# Patient Record
Sex: Female | Born: 2000 | Race: Black or African American | Hispanic: No | Marital: Single | Smoking: Never smoker
Health system: Southern US, Community
[De-identification: ages and names within clinical notes are randomized; demographics above are authoritative.]

## PROBLEM LIST (undated history)

## (undated) DIAGNOSIS — F419 Anxiety disorder, unspecified: Secondary | ICD-10-CM

## (undated) HISTORY — PX: TONGUE FLAP RELEASE: SHX2537

---
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
## 2020-08-29 ENCOUNTER — Other Ambulatory Visit: Payer: Self-pay

## 2020-08-29 ENCOUNTER — Emergency Department (HOSPITAL_COMMUNITY)
Admission: EM | Admit: 2020-08-29 | Discharge: 2020-08-29 | Disposition: A | Discharge status: Home / Self Care | Payer: Medicaid Other | Attending: Emergency Medicine | Admitting: Emergency Medicine

## 2020-08-29 ENCOUNTER — Encounter (HOSPITAL_COMMUNITY): Payer: Self-pay | Admitting: Emergency Medicine

## 2020-08-29 DIAGNOSIS — F419 Anxiety disorder, unspecified: Secondary | ICD-10-CM | POA: Diagnosis not present

## 2020-08-29 DIAGNOSIS — F418 Other specified anxiety disorders: Secondary | ICD-10-CM | POA: Diagnosis not present

## 2020-08-29 DIAGNOSIS — Y900 Blood alcohol level of less than 20 mg/100 ml: Secondary | ICD-10-CM | POA: Diagnosis not present

## 2020-08-29 DIAGNOSIS — Z79899 Other long term (current) drug therapy: Secondary | ICD-10-CM | POA: Diagnosis not present

## 2020-08-29 DIAGNOSIS — F32A Depression, unspecified: Secondary | ICD-10-CM

## 2020-08-29 LAB — CBC WITH DIFFERENTIAL/PLATELET
Abs Immature Granulocytes: 0.01 10*3/uL (ref 0.00–0.07)
Basophils Absolute: 0 10*3/uL (ref 0.0–0.1)
Basophils Relative: 0 %
Eosinophils Absolute: 0 10*3/uL (ref 0.0–0.5)
Eosinophils Relative: 1 %
HCT: 39.1 % (ref 36.0–46.0)
Hemoglobin: 12.5 g/dL (ref 12.0–15.0)
Immature Granulocytes: 0 %
Lymphocytes Relative: 38 %
Lymphs Abs: 1.7 10*3/uL (ref 0.7–4.0)
MCH: 30.2 pg (ref 26.0–34.0)
MCHC: 32 g/dL (ref 30.0–36.0)
MCV: 94.4 fL (ref 80.0–100.0)
Monocytes Absolute: 0.4 10*3/uL (ref 0.1–1.0)
Monocytes Relative: 8 %
Neutro Abs: 2.4 10*3/uL (ref 1.7–7.7)
Neutrophils Relative %: 53 %
Platelets: 248 10*3/uL (ref 150–400)
RBC: 4.14 MIL/uL (ref 3.87–5.11)
RDW: 12.4 % (ref 11.5–15.5)
WBC: 4.5 10*3/uL (ref 4.0–10.5)
nRBC: 0 % (ref 0.0–0.2)

## 2020-08-29 LAB — HCG, QUANTITATIVE, PREGNANCY: hCG, Beta Chain, Quant, S: <1 m[IU]/mL (ref <5)

## 2020-08-29 LAB — COMPREHENSIVE METABOLIC PANEL
ALT: 13 U/L (ref 0–44)
AST: 15 U/L (ref 15–41)
Albumin: 4 g/dL (ref 3.5–5.0)
Alkaline Phosphatase: 61 U/L (ref 38–126)
Anion gap: 8 (ref 5–15)
BUN: 6 mg/dL (ref 6–20)
CO2: 27 mmol/L (ref 22–32)
Calcium: 9.1 mg/dL (ref 8.9–10.3)
Chloride: 103 mmol/L (ref 98–111)
Creatinine, Ser: 0.67 mg/dL (ref 0.44–1.00)
GFR, Estimated: >60 mL/min (ref >60)
Glucose, Bld: 92 mg/dL (ref 70–99)
Potassium: 3.6 mmol/L (ref 3.5–5.1)
Sodium: 138 mmol/L (ref 135–145)
Total Bilirubin: 0.7 mg/dL (ref 0.3–1.2)
Total Protein: 7.4 g/dL (ref 6.5–8.1)

## 2020-08-29 LAB — RAPID URINE DRUG SCREEN, HOSP PERFORMED
Amphetamines: NOT DETECTED
Barbiturates: NOT DETECTED
Benzodiazepines: NOT DETECTED
Cocaine: NOT DETECTED
Opiates: NOT DETECTED
Tetrahydrocannabinol: NOT DETECTED

## 2020-08-29 LAB — ETHANOL: Alcohol, Ethyl (B): <10 mg/dL (ref <10)

## 2020-08-29 LAB — PREGNANCY, URINE: Preg Test, Ur: NEGATIVE

## 2020-08-29 MED ORDER — HYDROXYZINE HCL 25 MG PO TABS: 25.0000 mg | ORAL_TABLET | Freq: Three times a day (TID) | ORAL | Status: DC | PRN

## 2020-08-29 MED ORDER — FLUOXETINE HCL 10 MG PO CAPS
10.0000 mg | ORAL_CAPSULE | Freq: Every day | ORAL | Status: DC
  Administered 2020-08-29: 10 mg via ORAL
  Filled 2020-08-29: qty 1

## 2020-08-29 MED ORDER — FLUOXETINE HCL 10 MG PO TABS
10.0000 mg | ORAL_TABLET | Freq: Every day | ORAL | 1 refills | Status: AC
Start: 2020-08-29 — End: ?

## 2020-08-29 MED ORDER — HYDROXYZINE HCL 25 MG PO TABS: 25.0000 mg | ORAL_TABLET | Freq: Three times a day (TID) | ORAL | 1 refills | Status: AC | PRN

## 2020-08-29 NOTE — ED Provider Notes (Signed)
[facility] EMERGENCY DEPARTMENT Provider Note   CSN: [id number] Arrival date & time: 08/29/20  0059     History Chief Complaint  Patient presents with   Anxiety    Kristine Davis is a 20 y.o. female.  The history is provided by the patient and a parent.  Anxiety This is a recurrent problem. The current episode started more than 1 week ago. The problem occurs daily. The problem has been gradually worsening. Nothing aggravates the symptoms. Nothing relieves the symptoms.     Patient reports she has been dealing with anxiety for quite some time.  She reports over the past year she has significant life stressors.  She reports her father passed away over a year ago.  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  She also reports that she is moving to [place] soon with family and she is concerned about this.  She reports at times her anxiety is overwhelming She reports she has had previous thoughts of harming self, but no active plan at this time. She denies any alcohol or drug abuse    Past Surgical History:  Procedure Laterality Date   TONGUE FLAP RELEASE       OB History   No obstetric history on file.     History reviewed. No pertinent family history.  Social History   Tobacco Use   Smoking status: Never  Substance Use Topics   Alcohol use: No   Drug use: No    Home Medications Prior to Admission medications   Medication Sig Start Date End Date Taking? Authorizing Provider  ibuprofen (ADVIL,MOTRIN) 600 MG tablet Take 1 tablet (600 mg total) by mouth every 6 (six) hours as needed. 07/23/14   Ivery Quale, PA-C    Allergies    Strawberry extract  Review of Systems   Review of Systems  Constitutional:  Positive for appetite change and unexpected weight change. Negative for fever.  Gastrointestinal:  Negative for vomiting.  Psychiatric/Behavioral:  The patient is nervous/anxious.   All other systems reviewed and are  negative.  Physical Exam Updated Vital Signs BP 118/77   Pulse 69   Temp 98.2 F (36.8 C) (Oral)   Resp 18   Ht 1.676 m (5\' 6" )   Wt 77.1 kg   SpO2 99%   BMI 27.44 kg/m   Physical Exam CONSTITUTIONAL: Well developed/well nourished HEAD: Normocephalic/atraumatic EYES: EOMI/PERRL ENMT: Mask in place NECK: supple no meningeal signs CV: S1/S2 noted, no murmurs/rubs/gallops noted LUNGS: Lungs are clear to auscultation bilaterally, no apparent distress ABDOMEN: soft NEURO: Pt is awake/alert/appropriate, moves all extremitiesx4.  No facial droop.   EXTREMITIES: pulses normal/equal, full ROM SKIN: warm, color normal PSYCH: no abnormalities of mood noted, alert and oriented to situation  ED Results / Procedures / Treatments   Labs (all labs ordered are listed, but only abnormal results are displayed) Labs Reviewed  RESP PANEL BY RT-PCR (FLU A&B, COVID) ARPGX2  CBC WITH DIFFERENTIAL/PLATELET  COMPREHENSIVE METABOLIC PANEL  RAPID URINE DRUG SCREEN, HOSP PERFORMED  PREGNANCY, URINE  ETHANOL  HCG, QUANTITATIVE, PREGNANCY    EKG None  Radiology No results found.  Procedures Procedures   Medications Ordered in ED Medications - No data to display  ED Course  I have reviewed the triage vital signs and the nursing notes.    MDM Rules/Calculators/A&P  Patient presents with episodes of severe anxiety.  Patient has had multiple stressors in the past year.  She does report that she has had thoughts of harm in the past, but not tonight.  Patient has not had any outpatient evaluation and lacks resources for mental health services. I informed the patient that I am concerned about her mental health and lack of outpatient resources.  She agrees to stay in the emergency department to be seen by her behavioral health team.  Labs are pending at this time. Disposition pending behavioral health evaluation  7:08 AM Signed out to Dr Estell Harpin at Shift  change  Final Clinical Impression(s) / ED Diagnoses Final diagnoses:  Anxiety    Rx / DC Orders ED Discharge Orders     None        Zadie Rhine, MD 08/29/20 [phone]

## 2020-08-29 NOTE — Discharge Instructions (Addendum)
Follow-up as instructed by behavioral health 

## 2020-08-29 NOTE — ED Triage Notes (Signed)
Pt c/o anxiety on and off for the past few months. Pt also states she also hasn't been eating and drinking like she needs to.

## 2020-08-29 NOTE — Consult Note (Signed)
Telepsych Consultation   Reason for Consult:  Psych consult Referring Physician:  Zadie Rhine, MD Location of Patient: [facility] Location of Provider: [facility]  Patient Identification: Kristine Davis MRN:  [id number] Principal Diagnosis: Depression Diagnosis:  Principal Problem:   Depression Active Problems:   Anxiety   Total Time spent with patient: 45 minutes  Subjective:   Kristine Davis is a 20 y.o. female patient admitted with a recurrent problem. The current episode started more than 1 week ago. The problem occurs daily. The problem has been gradually worsening. Nothing aggravates the symptoms. Nothing relieves the symptoms.    Patient reports she has been dealing with anxiety for quite some time.  She reports over the past year she has significant life stressors.  She reports her father passed away over a year ago.  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  She also reports that she is moving to [place] soon with family and she is concerned about this.  She reports at times her anxiety is overwhelming She reports she has had previous thoughts of harming self, but no active plan at this time. She denies any alcohol or drug abuse  HPI:  Patient seen via tele health by this provider; chart reviewed and consulted with Dr. Lucianne Muss on 08/29/20.  On evaluation Kristine Davis is in a sitting position facing the camera with good eye contact. She is alert and oriented x4. She is calm and cooperative.Her mood is dysphoric and affect is congruent. Her thought process is logical and relevant.She is goal oriented and shows good insight. She denies suicidal ideations. She denies a past hx of suicide attempts. She does report a history of cutting because it made her feel better. She states that she last made cuts 1 month ago. She denies homicidal ideations. She denies auditory and visual hallucinations. She does not appear to be responding to internal or external  stimuli. She reports that she has been having anxiety, depression and other symptoms. She reports having anxiety for the past 7 years but states that it was never diagnosed. She states that her anxiety comes in stages depending on what she is going through. She states that last year her dad died due to cancer and she had a lot to deal with in school, and relationships. She describes her anxiety as feeling irritable, hot and cold flashes, dissociating, excessive crying, and worrying a lot. She states that her depression has been getting worse and that she did not grieve her father's death. She states that her depression is off and on and there are times where she feels euphoric and then feels stressed and depressed. She states that she often feels down thinking about life and her dad and then she will self sabotage and get really anxious. She reports having poor sleep when she is over thinking things. She reports on average getting 7 hours of sleep per night. She reports having a poor appetite since getting COVID last year and states that she stopped eating meat. She states that she lives with her mother and 74 year old brother. She denies having any guns or weapons in the home. She reports that her mother, boyfriend, and brother are her support persons. She denies having a past psychiatric history and has never been prescribed psychotropics. She is interested in medication management for depression and anxiety. We discussed starting Prozac 10 mg by mouth daily for depression and anxiety. We discussed starting Vistaril 25 mg by mouth  3 times a day as needed for anxiety. We discussed the side effects and benefits of taking these medications. Patient will be provided with a 30 day prescription for Prozac and Vistaril with 1 refill. Patient was advised to follow up with outpatient psychiatry for counseling services and medication management.     Past Psychiatric History: None  Risk to Self:  denies  Risk to  Others:  denies  Prior Inpatient Therapy:  no Prior Outpatient Therapy:  no  Past Medical History: History reviewed. No pertinent past medical history.  Past Surgical History:  Procedure Laterality Date   TONGUE FLAP RELEASE     Family History: History reviewed. No pertinent family history. Family Psychiatric  History: Unknown Social History:  Social History   Substance and Sexual Activity  Alcohol Use No     Social History   Substance and Sexual Activity  Drug Use No    Social History   Socioeconomic History   Marital status: Single    Spouse name: Not on file   Number of children: Not on file   Years of education: Not on file   Highest education level: Not on file  Occupational History   Not on file  Tobacco Use   Smoking status: Never   Smokeless tobacco: Not on file  Substance and Sexual Activity   Alcohol use: No   Drug use: No   Sexual activity: Never    Birth control/protection: None  Other Topics Concern   Not on file  Social History Narrative   Not on file   Social Determinants of Health   Financial Resource Strain: Not on file  Food Insecurity: Not on file  Transportation Needs: Not on file  Physical Activity: Not on file  Stress: Not on file  Social Connections: Not on file   Additional Social History:    Allergies:   Allergies  Allergen Reactions   Strawberry Extract     Labs:  Results for orders placed or performed during the hospital encounter of 08/29/20 (from the past 48 hour(s))  CBC with Differential/Platelet     Status: None   Collection Time: 08/29/20  7:00 AM  Result Value Ref Range   WBC 4.5 4.0 - 10.5 K/uL   RBC 4.14 3.87 - 5.11 MIL/uL   Hemoglobin 12.5 12.0 - 15.0 g/dL   HCT 00.1 74.9 - 44.9 %   MCV 94.4 80.0 - 100.0 fL   MCH 30.2 26.0 - 34.0 pg   MCHC 32.0 30.0 - 36.0 g/dL   RDW 67.5 91.6 - 38.4 %   Platelets 248 150 - 400 K/uL   nRBC 0.0 0.0 - 0.2 %   Neutrophils Relative % 53 %   Neutro Abs 2.4 1.7 - 7.7 K/uL    Lymphocytes Relative 38 %   Lymphs Abs 1.7 0.7 - 4.0 K/uL   Monocytes Relative 8 %   Monocytes Absolute 0.4 0.1 - 1.0 K/uL   Eosinophils Relative 1 %   Eosinophils Absolute 0.0 0.0 - 0.5 K/uL   Basophils Relative 0 %   Basophils Absolute 0.0 0.0 - 0.1 K/uL   Immature Granulocytes 0 %   Abs Immature Granulocytes 0.01 0.00 - 0.07 K/uL    Comment: Performed at [facility], [address] [place], [place] [address]  Comprehensive metabolic panel     Status: None   Collection Time: 08/29/20  7:00 AM  Result Value Ref Range   Sodium 138 135 - 145 mmol/L   Potassium 3.6 3.5 -  5.1 mmol/L   Chloride 103 98 - 111 mmol/L   CO2 27 22 - 32 mmol/L   Glucose, Bld 92 70 - 99 mg/dL    Comment: Glucose reference range applies only to samples taken after fasting for at least 8 hours.   BUN 6 6 - 20 mg/dL   Creatinine, Ser 1.610.67 0.44 - 1.00 mg/dL   Calcium 9.1 8.9 - 09.610.3 mg/dL   Total Protein 7.4 6.5 - 8.1 g/dL   Albumin 4.0 3.5 - 5.0 g/dL   AST 15 15 - 41 U/L   ALT 13 0 - 44 U/L   Alkaline Phosphatase 61 38 - 126 U/L   Total Bilirubin 0.7 0.3 - 1.2 mg/dL   GFR, Estimated >04>60 >54>60 mL/min    Comment: (NOTE) Calculated using the CKD-EPI Creatinine Equation (2021)    Anion gap 8 5 - 15    Comment: Performed at [facility], [address] [place], [place] [address]  Ethanol     Status: None   Collection Time: 08/29/20  7:00 AM  Result Value Ref Range   Alcohol, Ethyl (B) <10 <10 mg/dL    Comment: (NOTE) Lowest detectable limit for serum alcohol is 10 mg/dL.  For medical purposes only. Performed at [facility], [address] [place], [place] [address]   hCG, quantitative, pregnancy     Status: None   Collection Time: 08/29/20  7:00 AM  Result Value Ref Range   hCG, Beta Chain, Quant, S <1 <5 mIU/mL    Comment:          GEST. AGE      CONC.  (mIU/mL)   <=1 WEEK        5 - 50     2 WEEKS       50 - 500     3 WEEKS       100 - 10,000     4 WEEKS     1,000 - 30,000     5 WEEKS     3,500 -  115,000   6-8 WEEKS     12,000 - 270,000    12 WEEKS     15,000 - 220,000        FEMALE AND NON-PREGNANT FEMALE:     LESS THAN 5 mIU/mL Performed at [facility], [address] [place], [place] [address]   Pregnancy, urine     Status: None   Collection Time: 08/29/20 10:31 AM  Result Value Ref Range   Preg Test, Ur NEGATIVE NEGATIVE    Comment:        THE SENSITIVITY OF THIS METHODOLOGY IS >20 mIU/mL. Performed at [facility], [address] [place], [place] [address]   Rapid urine drug screen (hospital performed)     Status: None   Collection Time: 08/29/20 10:32 AM  Result Value Ref Range   Opiates NONE DETECTED NONE DETECTED   Cocaine NONE DETECTED NONE DETECTED   Benzodiazepines NONE DETECTED NONE DETECTED   Amphetamines NONE DETECTED NONE DETECTED   Tetrahydrocannabinol NONE DETECTED NONE DETECTED   Barbiturates NONE DETECTED NONE DETECTED    Comment: (NOTE) DRUG SCREEN FOR MEDICAL PURPOSES ONLY.  IF CONFIRMATION IS NEEDED FOR ANY PURPOSE, NOTIFY LAB WITHIN 5 DAYS.  LOWEST DETECTABLE LIMITS FOR URINE DRUG SCREEN Drug Class                     Cutoff (ng/mL) Amphetamine and metabolites    1000 Barbiturate and metabolites  200 Benzodiazepine                 200 Tricyclics and metabolites     300 Opiates and metabolites        300 Cocaine and metabolites        300 THC                            50 Performed at [facility], [address] [place], [place] [address]     Medications:  Current Facility-Administered Medications  Medication Dose Route Frequency Provider Last Rate Last Admin   FLUoxetine (PROZAC) capsule 10 mg  10 mg Oral Daily Janiel Crisostomo L, NP       hydrOXYzine (ATARAX/VISTARIL) tablet 25 mg  25 mg Oral TID PRN Justyne Roell L, NP       Current Outpatient Medications  Medication Sig Dispense Refill   ibuprofen (ADVIL,MOTRIN) 600 MG tablet Take 1 tablet (600 mg total) by mouth every 6 (six) hours as needed. 20 tablet 0     Musculoskeletal: Strength & Muscle Tone: UTA Gait & Station: UTA Patient leans: N/A   Psychiatric Specialty Exam:  Presentation  General Appearance: Appropriate for Environment  Eye Contact:Fair  Speech:Clear and Coherent  Speech Volume:Normal  Handedness: No data recorded  Mood and Affect  Mood:Dysphoric  Affect:Congruent   Thought Process  Thought Processes:Coherent; Goal Directed  Descriptions of Associations:Intact  Orientation:Full (Time, Place and Person)  Thought Content:WDL  History of Schizophrenia/Schizoaffective disorder:No data recorded Duration of Psychotic Symptoms:No data recorded Hallucinations:Hallucinations: None  Ideas of Reference:None  Suicidal Thoughts:Suicidal Thoughts: No  Homicidal Thoughts:Homicidal Thoughts: No   Sensorium  Memory:Immediate Fair; Recent Fair; Remote Fair  Judgment:Fair  Insight:Fair   Executive Functions  Concentration:Fair  Attention Span:Fair  Recall:Fair  Fund of Knowledge:Fair  Language:Fair   Psychomotor Activity  Psychomotor Activity:Psychomotor Activity: Normal   Assets  Assets:Desire for Improvement; Manufacturing systems engineer; Financial Resources/Insurance; Housing; Intimacy; Leisure Time; Physical Health; Social Support   Sleep  Sleep:Number of Hours of Sleep: 7    Physical Exam: Physical Exam Cardiovascular:     Rate and Rhythm: Normal rate.  Neurological:     Mental Status: She is alert.   Review of Systems  Constitutional: Negative.   HENT: Negative.    Eyes: Negative.   Respiratory: Negative.    Cardiovascular: Negative.   Gastrointestinal: Negative.   Genitourinary: Negative.   Skin: Negative.   Neurological: Negative.   Endo/Heme/Allergies: Negative.   Psychiatric/Behavioral:  Positive for depression. Negative for hallucinations, substance abuse and suicidal ideas. The patient is nervous/anxious.   Blood pressure 118/77, pulse 69, temperature 98.2 F (36.8 C),  temperature source Oral, resp. rate 18, height 5\' 6"  (1.676 m), weight 77.1 kg, SpO2 99 %. Body mass index is 27.44 kg/m.  Treatment Plan Summary: Plan Patient is psychiatrically cleared.  TOC consulted ordered for outpatient resources for psychiatry and therapy.  Medication management:  Vistaril 25 mg PO TID PRN for anxiety.  FLUoxetine  10 mg Oral Daily for depression    Disposition: No evidence of imminent risk to self or others at present.   Patient does not meet criteria for psychiatric inpatient admission. Supportive therapy provided about ongoing stressors. Discussed crisis plan, support from social network, calling 911, coming to the Emergency Department, and calling Suicide Hotline.   This service was provided via telemedicine using a 2-way, interactive audio and video technology.  Names of all persons participating in this  telemedicine service and their role in this encounter. Name: Royce Macadamia  Role: Patient   Name: Liborio Nixon  Role: NP  Name Role  Name:  Role:   A secure chat sent to Estell Harpin, MD., and Bonita Quin, RN., with the stated treatment plan and disposition.   Delvin Hedeen L, NP 08/29/2020 11:02 AM

## 2020-08-29 NOTE — ED Notes (Signed)
TTS at this time. 

## 2020-09-19 ENCOUNTER — Emergency Department (HOSPITAL_COMMUNITY)
Admission: EM | Admit: 2020-09-19 | Discharge: 2020-09-19 | Disposition: A | Discharge status: Home / Self Care | Payer: Medicaid Other | Attending: Emergency Medicine | Admitting: Emergency Medicine

## 2020-09-19 ENCOUNTER — Other Ambulatory Visit: Payer: Self-pay

## 2020-09-19 ENCOUNTER — Emergency Department (HOSPITAL_COMMUNITY): Payer: Medicaid Other

## 2020-09-19 ENCOUNTER — Encounter (HOSPITAL_COMMUNITY): Payer: Self-pay | Admitting: *Deleted

## 2020-09-19 DIAGNOSIS — F410 Panic disorder [episodic paroxysmal anxiety] without agoraphobia: Secondary | ICD-10-CM | POA: Insufficient documentation

## 2020-09-19 DIAGNOSIS — Z23 Encounter for immunization: Secondary | ICD-10-CM | POA: Insufficient documentation

## 2020-09-19 DIAGNOSIS — W450XXA Nail entering through skin, initial encounter: Secondary | ICD-10-CM | POA: Insufficient documentation

## 2020-09-19 DIAGNOSIS — S91331A Puncture wound without foreign body, right foot, initial encounter: Secondary | ICD-10-CM | POA: Insufficient documentation

## 2020-09-19 DIAGNOSIS — S99921A Unspecified injury of right foot, initial encounter: Secondary | ICD-10-CM | POA: Diagnosis present

## 2020-09-19 DIAGNOSIS — T148XXA Other injury of unspecified body region, initial encounter: Secondary | ICD-10-CM

## 2020-09-19 HISTORY — DX: Anxiety disorder, unspecified: F41.9

## 2020-09-19 MED ORDER — TETANUS-DIPHTH-ACELL PERTUSSIS 5-2.5-18.5 LF-MCG/0.5 IM SUSY
0.5000 mL | PREFILLED_SYRINGE | Freq: Once | INTRAMUSCULAR | Status: AC
  Administered 2020-09-19: 0.5 mL via INTRAMUSCULAR
  Filled 2020-09-19: qty 0.5

## 2020-09-19 MED ORDER — HYDROCODONE-ACETAMINOPHEN 5-325 MG PO TABS
1.0000 | ORAL_TABLET | Freq: Once | ORAL | Status: AC
Start: 2020-09-19 — End: 2020-09-19
  Administered 2020-09-19: 1 via ORAL
  Filled 2020-09-19: qty 1

## 2020-09-19 NOTE — ED Triage Notes (Signed)
Panic attack, stepped on a nail right foot

## 2020-09-19 NOTE — ED Provider Notes (Signed)
[facility] EMERGENCY DEPARTMENT Provider Note   CSN: [id number] Arrival date & time: 09/19/20  1428     History Chief Complaint  Patient presents with   Panic Attack    Kristine Davis is a 20 y.o. female.  HPI Patient presents by EMS for evaluation of a injury to her right foot, when she stepped on a nail.  She reports that EMS attendant pulled the nail out of her foot.  After that she had persistent pain in her right foot, and began to feel anxious.  She has chronic anxiety and is worried that she is having "a panic attack."  She cannot recall her tetanus status.  There are no other known active modifying factors.    Past Medical History:  Diagnosis Date   Anxiety     Patient Active Problem List   Diagnosis Date Noted   Depression 08/29/2020   Anxiety 08/29/2020    Past Surgical History:  Procedure Laterality Date   TONGUE FLAP RELEASE       OB History   No obstetric history on file.     No family history on file.  Social History   Tobacco Use   Smoking status: Never  Substance Use Topics   Alcohol use: No   Drug use: No    Home Medications Prior to Admission medications   Medication Sig Start Date End Date Taking? Authorizing Provider  FLUoxetine (PROZAC) 10 MG tablet Take 1 tablet (10 mg total) by mouth daily. 08/29/20   Bethann Berkshire, MD  hydrOXYzine (ATARAX/VISTARIL) 25 MG tablet Take 1 tablet (25 mg total) by mouth every 8 (eight) hours as needed for anxiety. 08/29/20   Bethann Berkshire, MD  ibuprofen (ADVIL,MOTRIN) 600 MG tablet Take 1 tablet (600 mg total) by mouth every 6 (six) hours as needed. 07/23/14   Ivery Quale, PA-C    Allergies    Strawberry extract  Review of Systems   Review of Systems  All other systems reviewed and are negative.  Physical Exam Updated Vital Signs BP 125/69   Pulse 76   Temp 98.2 F (36.8 C)   Resp 18   LMP 09/11/2020 (Exact Date)   SpO2 98%   Physical Exam Vitals and nursing note reviewed.   Constitutional:      General: She is in acute distress (Uncomfortable).     Appearance: She is well-developed. She is not ill-appearing.  HENT:     Head: Normocephalic and atraumatic.     Right Ear: External ear normal.     Left Ear: External ear normal.  Eyes:     Conjunctiva/sclera: Conjunctivae normal.     Pupils: Pupils are equal, round, and reactive to light.  Neck:     Trachea: Phonation normal.  Cardiovascular:     Rate and Rhythm: Normal rate.  Pulmonary:     Effort: Pulmonary effort is normal.  Abdominal:     General: There is no distension.  Musculoskeletal:        General: Normal range of motion.     Cervical back: Normal range of motion and neck supple.     Comments: Puncture wound plantar right foot, in the area of the second and third MTP joints.  No active bleeding.  She guards against movement of this area of her foot secondary to pain.  Skin:    General: Skin is warm and dry.  Neurological:     Mental Status: She is alert and oriented to person, place, and time.  Cranial Nerves: No cranial nerve deficit.     Sensory: No sensory deficit.     Motor: No abnormal muscle tone.     Coordination: Coordination normal.  Psychiatric:        Mood and Affect: Mood is anxious.        Speech: She is communicative. Speech is not rapid and pressured.        Behavior: Behavior normal. Behavior is not agitated or aggressive.        Thought Content: Thought content normal.        Cognition and Memory: Cognition normal.        Judgment: Judgment normal. Judgment is not impulsive.    ED Results / Procedures / Treatments   Labs (all labs ordered are listed, but only abnormal results are displayed) Labs Reviewed - No data to display  EKG None  Radiology DG Foot Complete Right  Result Date: 09/19/2020 CLINICAL DATA:  Stepped on nail EXAM: RIGHT FOOT COMPLETE - 3+ VIEW COMPARISON:  None. FINDINGS: There is no evidence of fracture or dislocation. There is no evidence of  arthropathy or other focal bone abnormality. Soft tissues are unremarkable. IMPRESSION: Negative. Electronically Signed   By: Jasmine Pang M.D.   On: 09/19/2020 15:46    Procedures Procedures   Medications Ordered in ED Medications  HYDROcodone-acetaminophen (NORCO/VICODIN) 5-325 MG per tablet 1 tablet (1 tablet Oral Given 09/19/20 1536)  Tdap (BOOSTRIX) injection 0.5 mL (0.5 mLs Intramuscular Given 09/19/20 1725)    ED Course  I have reviewed the triage vital signs and the nursing notes.  Pertinent labs & imaging results that were available during my care of the patient were reviewed by me and considered in my medical decision making (see chart for details).    MDM Rules/Calculators/A&P                            Patient Vitals for the past 24 hrs:  BP Temp Temp src Pulse Resp SpO2  09/19/20 1817 125/69 98.2 F (36.8 C) -- 76 18 98 %  09/19/20 1653 123/75 -- -- 86 18 100 %  09/19/20 1443 125/89 97.9 F (36.6 C) Oral (!) 123 20 100 %    At the time of discharge- reevaluation with update and discussion. After initial assessment and treatment, an updated evaluation reveals she is comfortable and has no further complaints. Mancel Bale   Medical Decision Making:  This patient is presenting for evaluation of puncture wound to foot, which does require a range of treatment options, and is a complaint that involves a moderate risk of morbidity and mortality. The differential diagnoses include puncture wound, retained foreign body, bony injury. I decided to review old records, and in summary Young female presenting with puncture wound to the foot from a nail, which she stepped on through her sandal, and then had a panic attack.  I did not require additional historical information from anyone.   Radiologic Tests Ordered, included foot x-ray.  I independently Visualized: Radiographic images, which show no foreign body or fracture   Critical Interventions-clinical evaluation,  radiography, medication treatment, observation and reassessment  After These Interventions, the Patient was reevaluated and was found patient improved.  More comfortable after dose of hydrocodone.  Tetanus booster given.  Findings discussed with her.  Her anxiety is controlled at discharge.  CRITICAL CARE-no Performed by: Mancel Bale  Nursing Notes Reviewed/ Care Coordinated Applicable Imaging Reviewed Interpretation of Laboratory Data  incorporated into ED treatment  The patient appears reasonably screened and/or stabilized for discharge and I doubt any other medical condition or other [facility] requiring further screening, evaluation, or treatment in the ED at this time prior to discharge.  Plan: Home Medications-OTC analgesia of choice; Home Treatments-soak foot wound 3 times daily until healed; return here if the recommended treatment, does not improve the symptoms; Recommended follow up-PCP or return here if needed     Final Clinical Impression(s) / ED Diagnoses Final diagnoses:  Puncture wound    Rx / DC Orders ED Discharge Orders     None        Mancel Bale, MD 09/20/20 1354

## 2020-09-19 NOTE — Discharge Instructions (Addendum)
Soak your right foot in warm water and clean it well with soap, 3 times a day.  Do this until the pain has resolved and the wound appears to be healed.  For pain use ibuprofen or acetaminophen.  Return here or see your doctor for problems.

## 2022-03-04 IMAGING — DX DG FOOT COMPLETE 3+V*R*
3 series · 3 of 3 positions shown · non-contrast
Comparison: None.

CLINICAL DATA: Stepped on nail

EXAM:
RIGHT FOOT COMPLETE - 3+ VIEW

[foot ap]
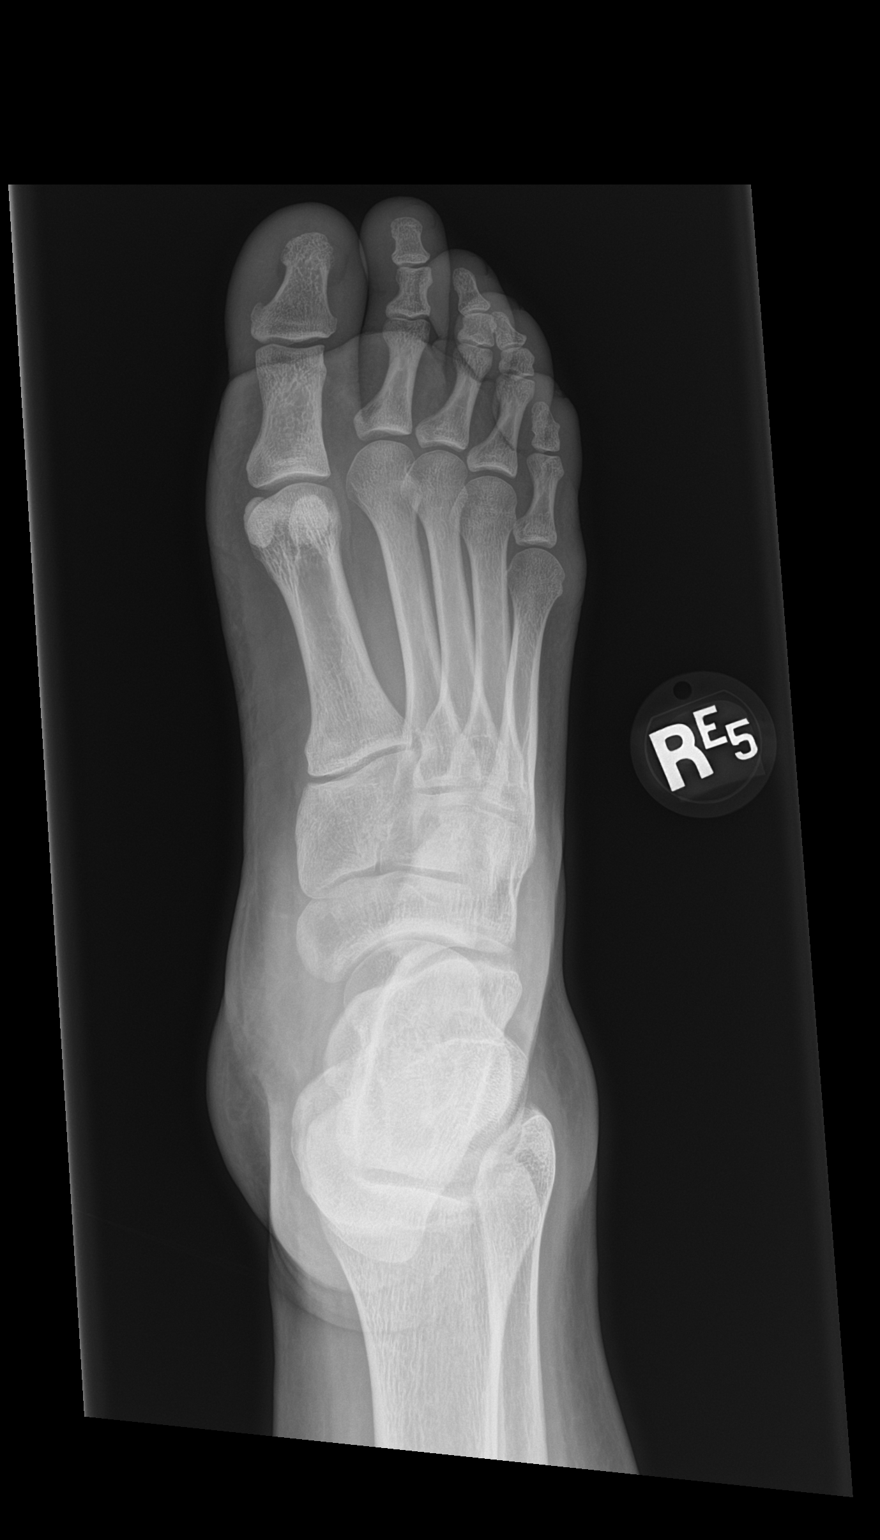

[foot obl]
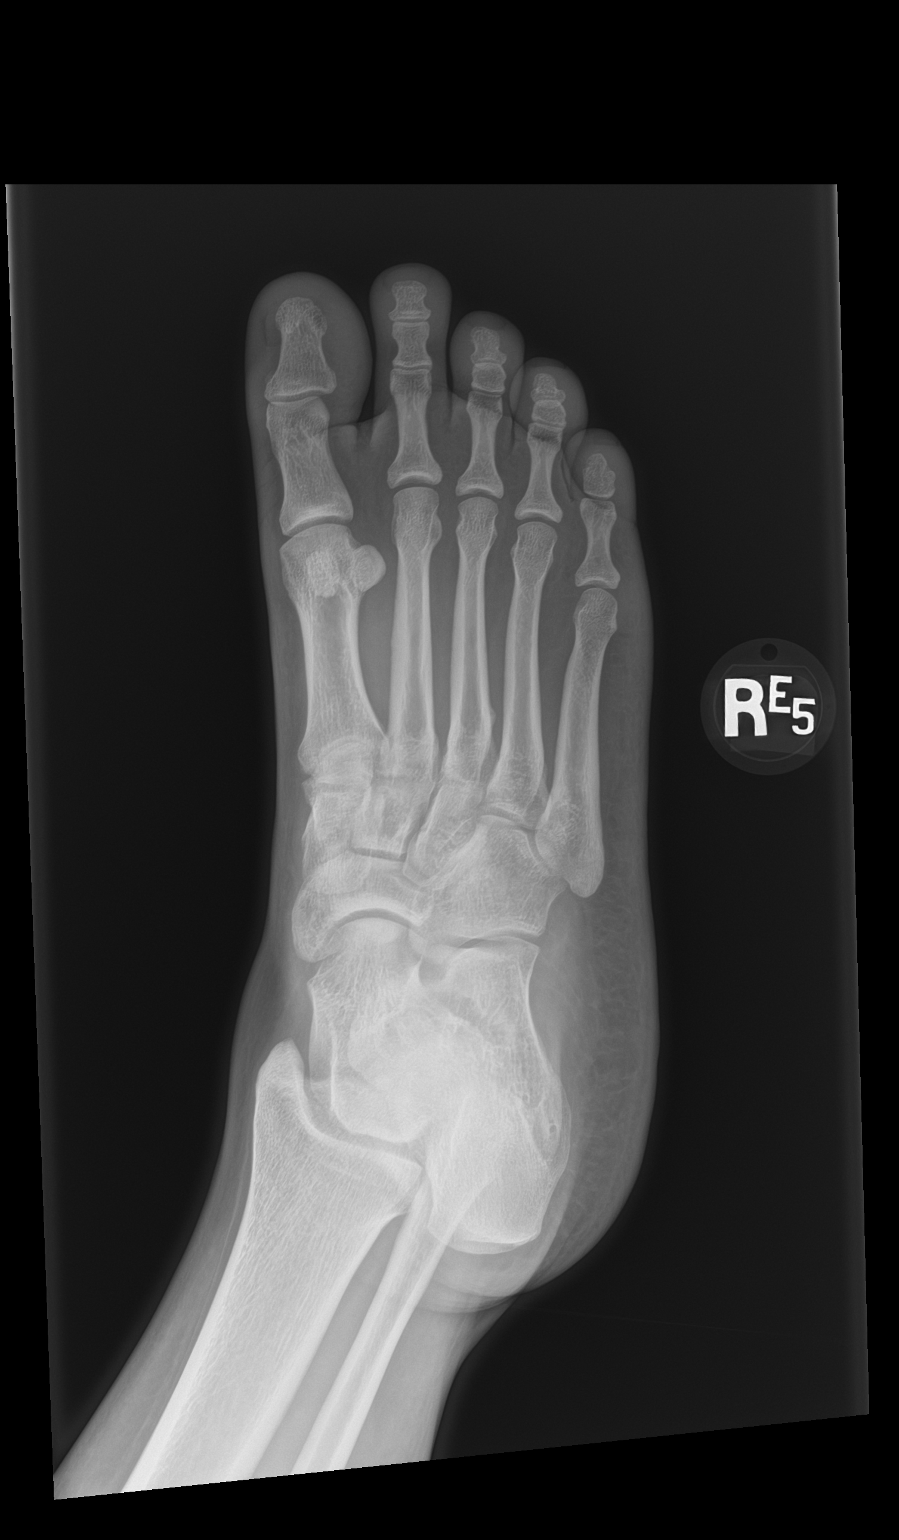

[foot lat]
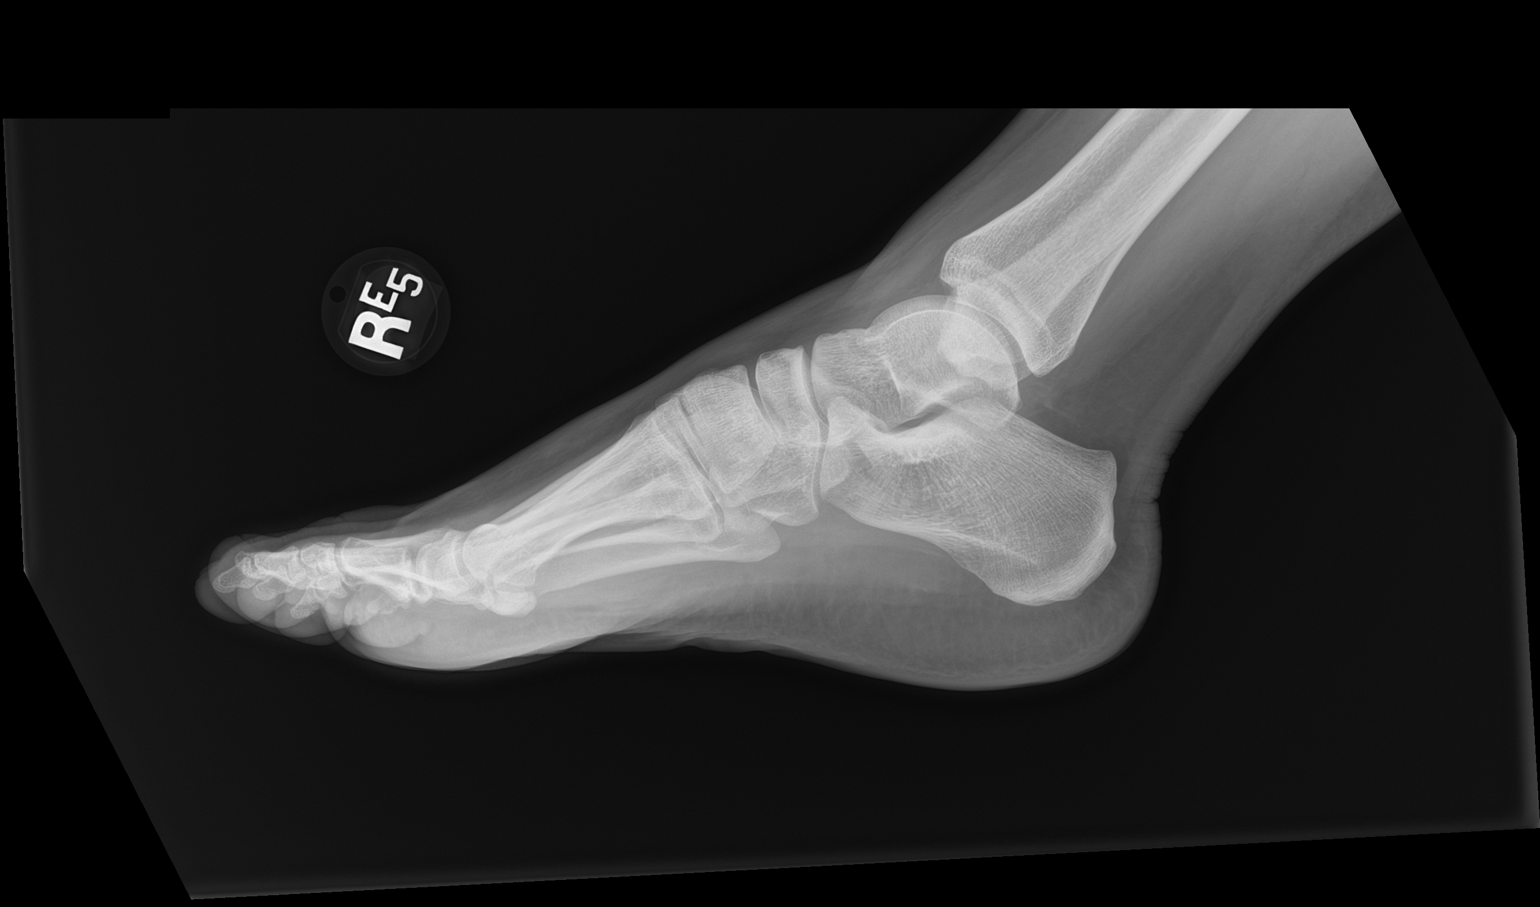

[3 of 3 positions shown; findings below may reference images not displayed]

FINDINGS: There is no evidence of fracture or dislocation. There is no
evidence of arthropathy or other focal bone abnormality. Soft
tissues are unremarkable.
IMPRESSION: Negative.
